# Patient Record
Sex: Male | Born: 1975 | Race: White | Hispanic: No | Marital: Married | Smoking: Never smoker
Health system: Southern US, Community
[De-identification: ages and names within clinical notes are randomized; demographics above are authoritative.]

## PROBLEM LIST (undated history)

## (undated) HISTORY — PX: TONSILLECTOMY: SUR1361

---

## 2020-05-20 ENCOUNTER — Encounter (HOSPITAL_BASED_OUTPATIENT_CLINIC_OR_DEPARTMENT_OTHER): Payer: Self-pay

## 2020-05-20 ENCOUNTER — Other Ambulatory Visit: Payer: Self-pay

## 2020-05-20 ENCOUNTER — Emergency Department (HOSPITAL_BASED_OUTPATIENT_CLINIC_OR_DEPARTMENT_OTHER): Payer: BC Managed Care – PPO | Admitting: Radiology

## 2020-05-20 ENCOUNTER — Emergency Department (HOSPITAL_BASED_OUTPATIENT_CLINIC_OR_DEPARTMENT_OTHER)
Admission: EM | Admit: 2020-05-20 | Discharge: 2020-05-20 | Disposition: A | Discharge status: Home / Self Care | Payer: BC Managed Care – PPO | Attending: Emergency Medicine | Admitting: Emergency Medicine

## 2020-05-20 DIAGNOSIS — S4991XA Unspecified injury of right shoulder and upper arm, initial encounter: Secondary | ICD-10-CM | POA: Diagnosis present

## 2020-05-20 DIAGNOSIS — W28XXXA Contact with powered lawn mower, initial encounter: Secondary | ICD-10-CM | POA: Diagnosis not present

## 2020-05-20 DIAGNOSIS — S43401A Unspecified sprain of right shoulder joint, initial encounter: Secondary | ICD-10-CM

## 2020-05-20 NOTE — ED Triage Notes (Signed)
He states that, as he was pulling a lawn mower start, it "kicked back and pulled my (right) shoulder. It still hurts today and I'm wondering if I pulled it out of joint".

## 2020-05-20 NOTE — ED Notes (Signed)
Pt discharged to home. Discharge instructions have been discussed with patient and/or family members. Pt verbally acknowledges understanding d/c instructions, and endorses comprehension to checkout at registration before leaving.  °

## 2020-05-20 NOTE — ED Notes (Signed)
Patient ambulated to X-ray 

## 2020-05-20 NOTE — Discharge Instructions (Addendum)
The x-rays did not show any signs of fracture or dislocation.  Take over-the-counter medications as needed for pain.  Avoid any strenuous activity with the right shoulder.  Follow-up with sports medicine orthopedic or primary care doctor if the symptoms do not improve over the next week

## 2020-05-20 NOTE — ED Notes (Signed)
ED Provider at bedside. 

## 2020-05-20 NOTE — ED Provider Notes (Signed)
[facility] EMERGENCY DEPT Provider Note   CSN: [id number] Arrival date & time: 05/20/20  0820     History Chief Complaint  Patient presents with  . Shoulder Pain    Eric Velez is a 45 y.o. male.  HPI   Patient presented to the ED for evaluation of right shoulder pain.  Patient states he was mowing the lawn yesterday evening.  He was pulling the cord on the lawnmower when he felt his shoulder pop.  Immediately experienced pain.  Patient took some ibuprofen last evening and went to bed.  This morning when he woke up it was not feeling any better.  He wonders if the shoulder might be out of joint.  He denies any other injuries or complaints.  No numbness or weakness  History reviewed. No pertinent past medical history.  There are no problems to display for this patient.   Past Surgical History:  Procedure Laterality Date  . TONSILLECTOMY         History reviewed. No pertinent family history.  Social History   Tobacco Use  . Smoking status: Never Smoker  . Smokeless tobacco: Never Used  Substance Use Topics  . Alcohol use: Yes    Home Medications Prior to Admission medications   Not on File    Allergies    Patient has no known allergies.  Review of Systems   Review of Systems  All other systems reviewed and are negative.   Physical Exam Updated Vital Signs BP 131/88 (BP Location: Left Arm)   Pulse 64   Temp 97.6 F (36.4 C) (Oral)   Resp 16   Ht 1.702 m (5\' 7" )   Wt 77.1 kg   SpO2 100%   BMI 26.63 kg/m   Physical Exam Vitals and nursing note reviewed.  Constitutional:      General: He is not in acute distress.    Appearance: He is well-developed.  HENT:     Head: Normocephalic and atraumatic.     Right Ear: External ear normal.     Left Ear: External ear normal.  Eyes:     General: No scleral icterus.       Right eye: No discharge.        Left eye: No discharge.     Conjunctiva/sclera: Conjunctivae normal.  Neck:      Trachea: No tracheal deviation.  Cardiovascular:     Rate and Rhythm: Normal rate.  Pulmonary:     Effort: Pulmonary effort is normal. No respiratory distress.     Breath sounds: No stridor.  Abdominal:     General: There is no distension.  Musculoskeletal:        General: No swelling or deformity.     Right shoulder: Tenderness present. No swelling, deformity or effusion. Normal pulse.     Cervical back: Neck supple.     Comments: Tenderness palpation posterior aspect of right shoulder, no tenderness or deformity palpated along the clavicle, no effusion, distal neurovascular intact  Skin:    General: Skin is warm and dry.     Findings: No rash.  Neurological:     Mental Status: He is alert.     Cranial Nerves: Cranial nerve deficit: no gross deficits.     ED Results / Procedures / Treatments   Labs (all labs ordered are listed, but only abnormal results are displayed) Labs Reviewed - No data to display  EKG None  Radiology DG Shoulder Right  Result Date: 05/20/2020 CLINICAL DATA:  Right shoulder pain after trying to start a lawn mower yesterday. EXAM: RIGHT SHOULDER - 2+ VIEW COMPARISON:  None. FINDINGS: There is no evidence of fracture or dislocation. There is no evidence of arthropathy or other focal bone abnormality. Soft tissues are unremarkable. IMPRESSION: Normal examination. Electronically Signed   By: Beckie Salts M.D.   On: 05/20/2020 09:27    Procedures Procedures   Medications Ordered in ED Medications - No data to display  ED Course  I have reviewed the triage vital signs and the nursing notes.  Pertinent labs & imaging results that were available during my care of the patient were reviewed by me and considered in my medical decision making (see chart for details).    MDM Rules/Calculators/A&P                          Patient presented to the ED for evaluation of shoulder injury.  X-rays do not show any signs of fracture or dislocation.  Most likely  ligamentous, sprain injury.  Recommend over-the-counter medications for pain as needed.  Follow-up with a primary care doctor, sports medicine orthopedic doctor if symptoms do not improve/resolve in the next week Final Clinical Impression(s) / ED Diagnoses Final diagnoses:  Sprain of right shoulder, unspecified shoulder sprain type, initial encounter    Rx / DC Orders ED Discharge Orders    None       Linwood Dibbles, MD 05/20/20 [phone]

## 2021-10-28 IMAGING — DX DG SHOULDER 2+V*R*
3 series · 3 of 3 positions shown · non-contrast
Comparison: None.

CLINICAL DATA: Right shoulder pain after trying to start a lawn
mower yesterday.

EXAM:
RIGHT SHOULDER - 2+ VIEW

[shoulder grashey]
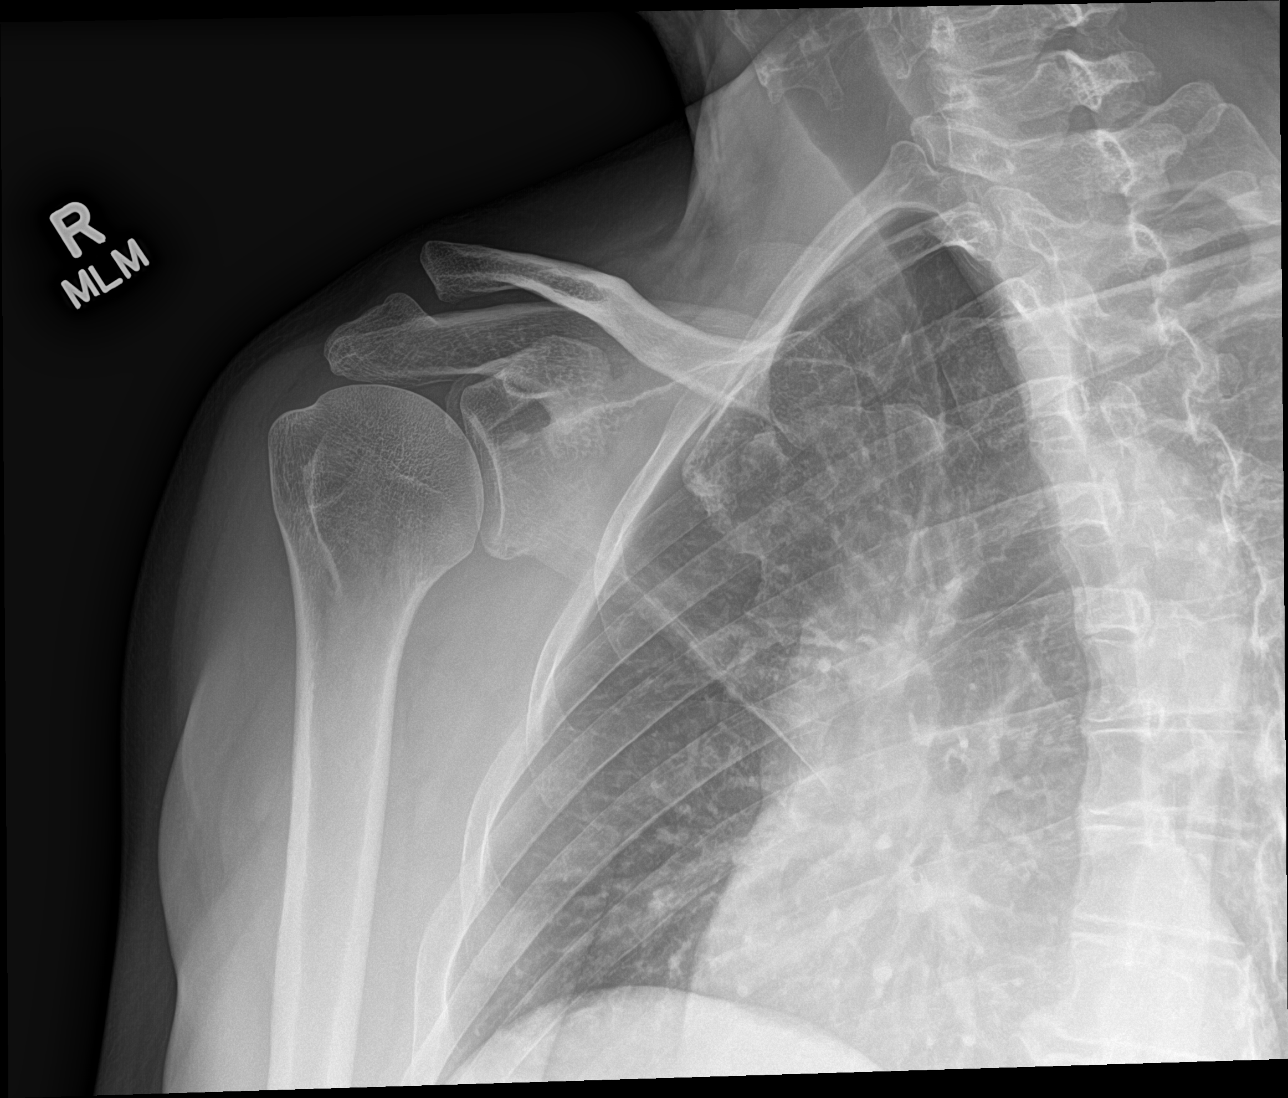

[shoulder axillary]
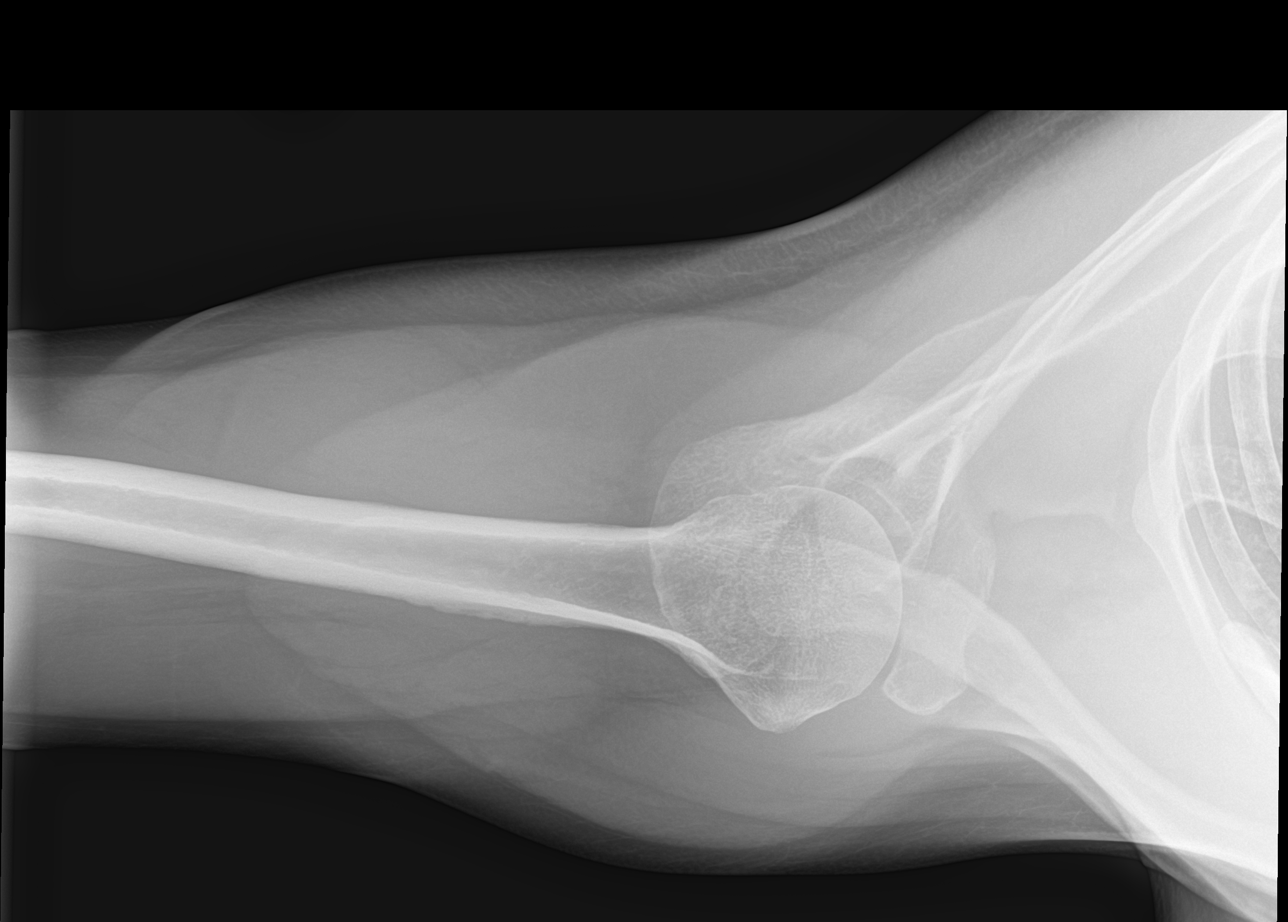

[shoulder y view]
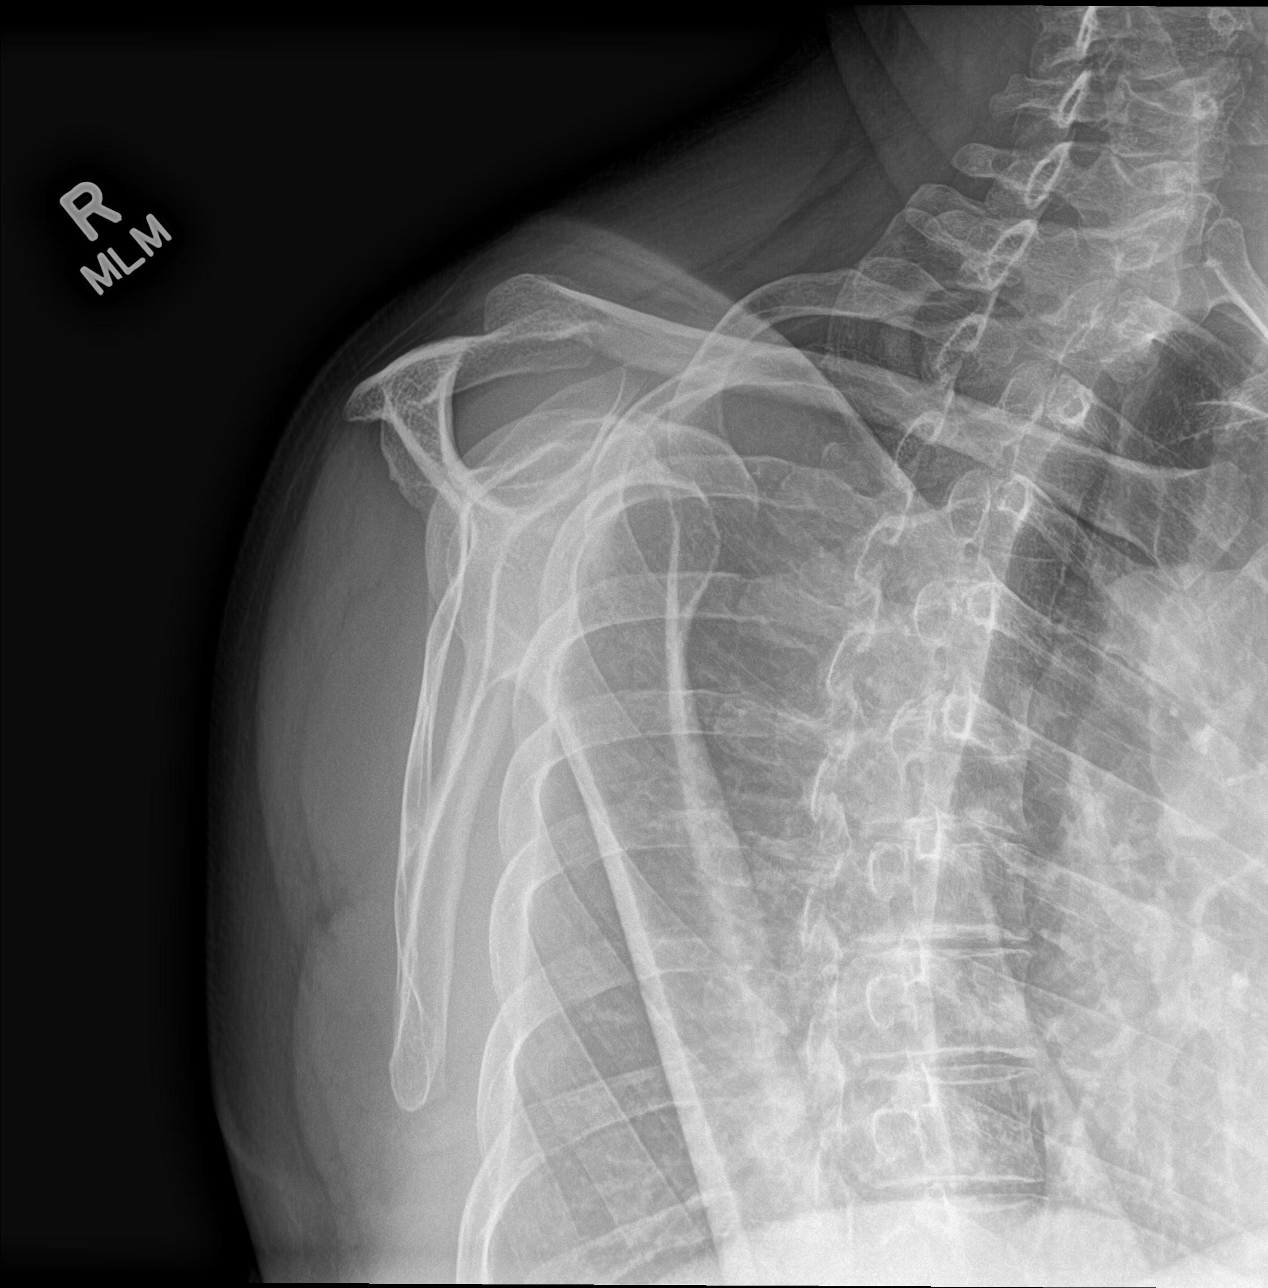

[3 of 3 positions shown; findings below may reference images not displayed]

FINDINGS: There is no evidence of fracture or dislocation. There is no
evidence of arthropathy or other focal bone abnormality. Soft
tissues are unremarkable.
IMPRESSION: Normal examination.
